# Patient Record
Sex: Male | Born: 1950 | Race: White | Hispanic: No | Marital: Married | Smoking: Former smoker
Health system: Southern US, Community
[De-identification: ages and names within clinical notes are randomized; demographics above are authoritative.]

## PROBLEM LIST (undated history)

## (undated) DIAGNOSIS — K219 Gastro-esophageal reflux disease without esophagitis: Secondary | ICD-10-CM

## (undated) DIAGNOSIS — E785 Hyperlipidemia, unspecified: Secondary | ICD-10-CM

## (undated) DIAGNOSIS — I10 Essential (primary) hypertension: Secondary | ICD-10-CM

## (undated) HISTORY — DX: Hyperlipidemia, unspecified: E78.5

## (undated) HISTORY — DX: Gastro-esophageal reflux disease without esophagitis: K21.9

## (undated) HISTORY — DX: Essential (primary) hypertension: I10

---

## 2015-12-09 DIAGNOSIS — M2011 Hallux valgus (acquired), right foot: Secondary | ICD-10-CM | POA: Diagnosis not present

## 2015-12-09 DIAGNOSIS — M898X9 Other specified disorders of bone, unspecified site: Secondary | ICD-10-CM | POA: Diagnosis not present

## 2016-03-30 DIAGNOSIS — H2512 Age-related nuclear cataract, left eye: Secondary | ICD-10-CM | POA: Diagnosis not present

## 2016-04-18 DIAGNOSIS — E7800 Pure hypercholesterolemia, unspecified: Secondary | ICD-10-CM | POA: Diagnosis not present

## 2016-04-18 DIAGNOSIS — Z79899 Other long term (current) drug therapy: Secondary | ICD-10-CM | POA: Diagnosis not present

## 2016-04-18 DIAGNOSIS — Z125 Encounter for screening for malignant neoplasm of prostate: Secondary | ICD-10-CM | POA: Diagnosis not present

## 2016-05-02 DIAGNOSIS — M5136 Other intervertebral disc degeneration, lumbar region: Secondary | ICD-10-CM | POA: Diagnosis not present

## 2016-05-02 DIAGNOSIS — M5134 Other intervertebral disc degeneration, thoracic region: Secondary | ICD-10-CM | POA: Diagnosis not present

## 2016-05-02 DIAGNOSIS — M542 Cervicalgia: Secondary | ICD-10-CM | POA: Diagnosis not present

## 2016-05-02 DIAGNOSIS — M9901 Segmental and somatic dysfunction of cervical region: Secondary | ICD-10-CM | POA: Diagnosis not present

## 2016-05-02 DIAGNOSIS — M545 Low back pain: Secondary | ICD-10-CM | POA: Diagnosis not present

## 2016-05-02 DIAGNOSIS — M5030 Other cervical disc degeneration, unspecified cervical region: Secondary | ICD-10-CM | POA: Diagnosis not present

## 2016-05-02 DIAGNOSIS — M9902 Segmental and somatic dysfunction of thoracic region: Secondary | ICD-10-CM | POA: Diagnosis not present

## 2016-05-02 DIAGNOSIS — M9903 Segmental and somatic dysfunction of lumbar region: Secondary | ICD-10-CM | POA: Diagnosis not present

## 2016-05-02 DIAGNOSIS — M546 Pain in thoracic spine: Secondary | ICD-10-CM | POA: Diagnosis not present

## 2016-05-05 DIAGNOSIS — M5136 Other intervertebral disc degeneration, lumbar region: Secondary | ICD-10-CM | POA: Diagnosis not present

## 2016-05-05 DIAGNOSIS — M9903 Segmental and somatic dysfunction of lumbar region: Secondary | ICD-10-CM | POA: Diagnosis not present

## 2016-05-05 DIAGNOSIS — M5134 Other intervertebral disc degeneration, thoracic region: Secondary | ICD-10-CM | POA: Diagnosis not present

## 2016-05-05 DIAGNOSIS — M5030 Other cervical disc degeneration, unspecified cervical region: Secondary | ICD-10-CM | POA: Diagnosis not present

## 2016-05-05 DIAGNOSIS — M546 Pain in thoracic spine: Secondary | ICD-10-CM | POA: Diagnosis not present

## 2016-05-05 DIAGNOSIS — M542 Cervicalgia: Secondary | ICD-10-CM | POA: Diagnosis not present

## 2016-05-05 DIAGNOSIS — M9901 Segmental and somatic dysfunction of cervical region: Secondary | ICD-10-CM | POA: Diagnosis not present

## 2016-05-05 DIAGNOSIS — M9902 Segmental and somatic dysfunction of thoracic region: Secondary | ICD-10-CM | POA: Diagnosis not present

## 2016-05-05 DIAGNOSIS — M545 Low back pain: Secondary | ICD-10-CM | POA: Diagnosis not present

## 2016-06-06 ENCOUNTER — Ambulatory Visit: Payer: Self-pay | Admitting: Family Medicine

## 2016-06-22 ENCOUNTER — Ambulatory Visit (INDEPENDENT_AMBULATORY_CARE_PROVIDER_SITE_OTHER): Payer: PPO | Admitting: Family Medicine

## 2016-06-22 ENCOUNTER — Encounter: Payer: Self-pay | Admitting: Family Medicine

## 2016-06-22 VITALS — BP 132/78 | HR 66 | Temp 98.2°F | Resp 17 | Ht 66.5 in | Wt 150.0 lb

## 2016-06-22 DIAGNOSIS — I10 Essential (primary) hypertension: Secondary | ICD-10-CM | POA: Diagnosis not present

## 2016-06-22 DIAGNOSIS — S51801A Unspecified open wound of right forearm, initial encounter: Secondary | ICD-10-CM | POA: Diagnosis not present

## 2016-06-22 DIAGNOSIS — Z23 Encounter for immunization: Secondary | ICD-10-CM | POA: Diagnosis not present

## 2016-06-22 MED ORDER — VALSARTAN-HYDROCHLOROTHIAZIDE 320-25 MG PO TABS: ORAL_TABLET | ORAL | 1 refills | Status: DC

## 2016-06-22 NOTE — Patient Instructions (Addendum)
Health Maintenance, Male A healthy lifestyle and preventive care is important for your health and wellness. Ask your health care provider about what schedule of regular examinations is right for you. What should I know about weight and diet?  Eat a Healthy Diet  Eat plenty of vegetables, fruits, whole grains, low-fat dairy products, and lean protein.  Do not eat a lot of foods high in solid fats, added sugars, or salt. Maintain a Healthy Weight  Regular exercise can help you achieve or maintain a healthy weight. You should:  Do at least 150 minutes of exercise each week. The exercise should increase your heart rate and make you sweat (moderate-intensity exercise).  Do strength-training exercises at least twice a week. Watch Your Levels of Cholesterol and Blood Lipids  Have your blood tested for lipids and cholesterol every 5 years starting at 67 years of age. If you are at high risk for heart disease, you should start having your blood tested when you are 67 years old. You may need to have your cholesterol levels checked more often if:  Your lipid or cholesterol levels are high.  You are older than 67 years of age.  You are at high risk for heart disease. What should I know about cancer screening? Many types of cancers can be detected early and may often be prevented. Lung Cancer  You should be screened every year for lung cancer if:  You are a current smoker who has smoked for at least 30 years.  You are a former smoker who has quit within the past 15 years.  Talk to your health care provider about your screening options, when you should start screening, and how often you should be screened. Colorectal Cancer  Routine colorectal cancer screening usually begins at 67 years of age and should be repeated every 5-10 years until you are 67 years old. You may need to be screened more often if early forms of precancerous polyps or small growths are found. Your health care provider may  recommend screening at an earlier age if you have risk factors for colon cancer.  Your health care provider may recommend using home test kits to check for hidden blood in the stool.  A small camera at the end of a tube can be used to examine your colon (sigmoidoscopy or colonoscopy). This checks for the earliest forms of colorectal cancer. Prostate and Testicular Cancer  Depending on your age and overall health, your health care provider may do certain tests to screen for prostate and testicular cancer.  Talk to your health care provider about any symptoms or concerns you have about testicular or prostate cancer. Skin Cancer  Check your skin from head to toe regularly.  Tell your health care provider about any new moles or changes in moles, especially if:  There is a change in a mole's size, shape, or color.  You have a mole that is larger than a pencil eraser.  Always use sunscreen. Apply sunscreen liberally and repeat throughout the day.  Protect yourself by wearing long sleeves, pants, a wide-brimmed hat, and sunglasses when outside. What should I know about heart disease, diabetes, and high blood pressure?  If you are 18-39 years of age, have your blood pressure checked every 3-5 years. If you are 40 years of age or older, have your blood pressure checked every year. You should have your blood pressure measured twice-once when you are at a hospital or clinic, and once when you are not at a   hospital or clinic. Record the average of the two measurements. To check your blood pressure when you are not at a hospital or clinic, you can use:  An automated blood pressure machine at a pharmacy.  A home blood pressure monitor.  Talk to your health care provider about your target blood pressure.  If you are between 52-74 years old, ask your health care provider if you should take aspirin to prevent heart disease.  Have regular diabetes screenings by checking your fasting blood sugar  level.  If you are at a normal weight and have a low risk for diabetes, have this test once every three years after the age of 61.  If you are overweight and have a high risk for diabetes, consider being tested at a younger age or more often.  A one-time screening for abdominal aortic aneurysm (AAA) by ultrasound is recommended for men aged 24-75 years who are current or former smokers. What should I know about preventing infection? Hepatitis B  If you have a higher risk for hepatitis B, you should be screened for this virus. Talk with your health care provider to find out if you are at risk for hepatitis B infection. Hepatitis C  Blood testing is recommended for:  Everyone born from [birth year] through 1966.  Anyone with known risk factors for hepatitis C. Sexually Transmitted Diseases (STDs)  You should be screened each year for STDs including gonorrhea and chlamydia if:  You are sexually active and are younger than 67 years of age.  You are older than 67 years of age and your health care provider tells you that you are at risk for this type of infection.  Your sexual activity has changed since you were last screened and you are at an increased risk for chlamydia or gonorrhea. Ask your health care provider if you are at risk.  Talk with your health care provider about whether you are at high risk of being infected with HIV. Your health care provider may recommend a prescription medicine to help prevent HIV infection. What else can I do?  Schedule regular health, dental, and eye exams.  Stay current with your vaccines (immunizations).  Do not use any tobacco products, such as cigarettes, chewing tobacco, and e-cigarettes. If you need help quitting, ask your health care provider.  Limit alcohol intake to no more than 2 drinks per day. One drink equals 12 ounces of beer, 5 ounces of wine, or 1 ounces of hard liquor.  Do not use street drugs.  Do not share needles.  Ask your health  care provider for help if you need support or information about quitting drugs.  Tell your health care provider if you often feel depressed.  Tell your health care provider if you have ever been abused or do not feel safe at home. This information is not intended to replace advice given to you by your health care provider. Make sure you discuss any questions you have with your health care provider. Document Released: 09/10/2007 Document Revised: 11/11/2015 Document Reviewed: 12/16/2014 Elsevier Interactive Patient Education  2017 [place] (Tetanus and Diphtheria): What You Need to Know 1. Why get vaccinated? Tetanus  and diphtheria are very serious diseases. They are rare in the [place] today, but people who do become infected often have severe complications. Td vaccine is used to protect adolescents and adults from both of these diseases. Both tetanus and diphtheria are infections caused by bacteria. Diphtheria spreads from person to person through  coughing or sneezing. Tetanus-causing bacteria enter the body through cuts, scratches, or wounds. TETANUS (lockjaw) causes painful muscle tightening and stiffness, usually all over the body.  It can lead to tightening of muscles in the head and neck so you can't open your mouth, swallow, or sometimes even breathe. Tetanus kills about 1 out of every 10 people who are infected even after receiving the best medical care. DIPHTHERIA can cause a thick coating to form in the back of the throat.  It can lead to breathing problems, paralysis, heart failure, and death. Before vaccines, as many as 200,000 cases of diphtheria and hundreds of cases of tetanus were reported in the [place] each year. Since vaccination began, reports of cases for both diseases have dropped by about 99%. 2. Td vaccine Td vaccine can protect adolescents and adults from tetanus and diphtheria. Td is usually given as a booster dose every 10 years but it can  also be given earlier after a severe and dirty wound or burn. Another vaccine, called Tdap, which protects against pertussis in addition to tetanus and diphtheria, is sometimes recommended instead of Td vaccine. Your doctor or the person giving you the vaccine can give you more information. Td may safely be given at the same time as other vaccines. 3. Some people should not get this vaccine  A person who has ever had a life-threatening allergic reaction after a previous dose of any tetanus or diphtheria containing vaccine, OR has a severe allergy to any part of this vaccine, should not get Td vaccine. Tell the person giving the vaccine about any severe allergies.  Talk to your doctor if you:  had severe pain or swelling after any vaccine containing diphtheria or tetanus,  ever had a condition called Guillain Barre Syndrome (GBS),  aren't feeling well on the day the shot is scheduled. 4. What are the risks from Td vaccine? With any medicine, including vaccines, there is a chance of side effects. These are usually mild and go away on their own. Serious reactions are also possible but are rare. Most people who get Td vaccine do not have any problems with it. Mild problems following Td vaccine:  (Did not interfere with activities)  Pain where the shot was given (about 8 people in 10)  Redness or swelling where the shot was given (about 1 person in 4)  Mild fever (rare)  Headache (about 1 person in 4)  Tiredness (about 1 person in 4) Moderate problems following Td vaccine:  (Interfered with activities, but did not require medical attention)  Fever over 102F (rare) Severe problems following Td vaccine:  (Unable to perform usual activities; required medical attention)  Swelling, severe pain, bleeding and/or redness in the arm where the shot was given (rare). Problems that could happen after any vaccine:   People sometimes faint after a medical procedure, including vaccination.  Sitting or lying down for about 15 minutes can help prevent fainting, and injuries caused by a fall. Tell your doctor if you feel dizzy, or have vision changes or ringing in the ears.  Some people get severe pain in the shoulder and have difficulty moving the arm where a shot was given. This happens very rarely.  Any medication can cause a severe allergic reaction. Such reactions from a vaccine are very rare, estimated at fewer than 1 in a million doses, and would happen within a few minutes to a few hours after the vaccination. As with any medicine, there is a very remote chance  of a vaccine causing a serious injury or death. The safety of vaccines is always being monitored. For more information, visit: http://floyd.org/www.cdc.gov/vaccinesafety/ 5. What if there is a serious reaction? What should I look for?  Look for anything that concerns you, such as signs of a severe allergic reaction, very high fever, or unusual behavior. Signs of a severe allergic reaction can include hives, swelling of the face and throat, difficulty breathing, a fast heartbeat, dizziness, and weakness. These would usually start a few minutes to a few hours after the vaccination. What should I do?   If you think it is a severe allergic reaction or other emergency that can't wait, call 9-1-1 or get the person to the nearest hospital. Otherwise, call your doctor.  Afterward, the reaction should be reported to the Vaccine Adverse Event Reporting System (VAERS). Your doctor might file this report, or you can do it yourself through the VAERS web site at www.vaers.LAgents.nohhs.gov, or by calling 1-281-033-1454.  VAERS does not give medical advice. 6. The National Vaccine Injury Compensation Program The [employer] Vaccine Injury Compensation Program (VICP) is a federal program that was created to compensate people who may have been injured by certain vaccines. Persons who believe they may have been injured by a vaccine can learn about the program and  about filing a claim by calling 1-504-873-6121 or visiting the VICP website at SpiritualWord.atwww.hrsa.gov/vaccinecompensation. There is a time limit to file a claim for compensation. 7. How can I learn more?  Ask your doctor. He or she can give you the vaccine package insert or suggest other sources of information.  Call your local or state health department.  Contact the Centers for Disease Control and Prevention (CDC):  Call (910)503-21351-864 831 1735 (1-800-CDC-INFO)  Visit CDC's website at PicCapture.uywww.cdc.gov/vaccines CDC Td Vaccine VIS (07/07/15) This information is not intended to replace advice given to you by your health care provider. Make sure you discuss any questions you have with your health care provider. Document Released: 01/09/2006 Document Revised: 12/03/2015 Document Reviewed: 12/03/2015 Elsevier Interactive Patient Education  2017 [employer]

## 2016-06-22 NOTE — Progress Notes (Signed)
BP 132/78 (BP Location: Left Arm, Patient Position: Sitting, Cuff Size: Normal)   Pulse 66   Temp 98.2 F (36.8 C) (Oral)   Resp 17   Ht 5' 6.5" (1.689 m)   Wt 150 lb (68 kg)   SpO2 98%   BMI 23.85 kg/m    Subjective:    Patient ID: Steven Browning, male    DOB: 09/05/1950, 66 y.o.   MRN: [id number]  HPI: Steven Browning is a 66 y.o. male who presents today to establish care. [facility]  Chief Complaint  Patient presents with  . Establish Care   HYPERTENSION Hypertension status: controlled  Satisfied with current treatment? yes Duration of hypertension: chronic BP monitoring frequency:  not checking BP medication side effects:  no Medication compliance: excellent compliance Previous BP meds: valsartan Aspirin: no Recurrent headaches: no Visual changes: no Palpitations: no Dyspnea: no Chest pain: no Lower extremity edema: no Dizzy/lightheaded: no  Active Ambulatory Problems    Diagnosis Date Noted  . Hypertension    Resolved Ambulatory Problems    Diagnosis Date Noted  . No Resolved Ambulatory Problems   Past Medical History:  Diagnosis Date  . GERD (gastroesophageal reflux disease)   . Hyperlipidemia   . Hypertension    History reviewed. No pertinent surgical history.  Outpatient Encounter Prescriptions as of 06/22/2016  Medication Sig  . Arginine 500 MG CAPS Take 500 mg by mouth daily. Takes 4 per day  . Ascorbic Acid (VITAMIN C) 1000 MG tablet Take 1,000 mg by mouth daily. Takes 4 per day  . b complex vitamins tablet Take 1 tablet by mouth daily.  . Calcium Carb-Cholecalciferol (CALCIUM + D3 PO) Take 600 mg by mouth 4 (four) times daily.  Marland Kitchen. CINNAMON PO Take 1,000 mg by mouth daily.  Marland Kitchen. CRANBERRY EXTRACT PO Take 68 mg by mouth daily. Takes 3 per day  . Ginkgo Biloba 40 MG TABS Take 60 mg by mouth daily.  Marland Kitchen. GLUCOSAMINE HCL-MSM PO Take 3,000 mg by mouth 2 (two) times daily.  . Grape Seed Extract 100 MG CAPS Take 100 mg by mouth daily. Takes  2 per day  . IRON PO Take 65 mg by mouth daily. Takes 2 per day  . Lactobacillus (PROBIOTIC ACIDOPHILUS PO) Take by mouth daily.  . Lycopene 10 MG CAPS Take 10 mg by mouth daily.  . Magnesium 500 MG CAPS Take 500 mg by mouth daily.  . milk thistle 175 MG tablet Take 175 mg by mouth daily. Takes 2 per day  . Multiple Vitamins-Minerals (MULTIVITAMIN WITH MINERALS) tablet Take 1 tablet by mouth daily. Takes 2 per day  . NON FORMULARY Take 565 mg by mouth daily. Takes 3 per day  . Nutritional Supplements (SYTRINOL PO) Take 150 mg by mouth daily. Takes 2 per day  . omega-3 acid ethyl esters (LOVAZA) 1 g capsule Take by mouth daily. Takes 6 per day  . Plant Sterols and Stanols (CHOLEST OFF PO) Take by mouth. Takes 6 per day  . Potassium 99 MG TABS Take 99 mg by mouth daily.  . Saw Palmetto 450 MG CAPS Take 450 mg by mouth daily. Takes 2 per day  . Turmeric 500 MG TABS Take 500 mg by mouth daily. Takes 2 per day  . valsartan-hydrochlorothiazide (DIOVAN-HCT) 320-25 MG tablet TK 1 T PO QD  . vitamin E 400 UNIT capsule Take 400 Units by mouth daily. Takes 2 per day  . [DISCONTINUED] valsartan-hydrochlorothiazide (DIOVAN-HCT) 320-25 MG tablet TK  1 T PO QD   No facility-administered encounter medications on file as of 06/22/2016.    Allergies  Allergen Reactions  . Augmentin [Amoxicillin-Pot Clavulanate] Other (See Comments)    Stomach upset  . Statins Other (See Comments)    Muscle spasms    Social History   Social History  . Marital status: Married    Spouse name: N/A  . Number of children: N/A  . Years of education: N/A   Occupational History  . Not on file.   Social History Main Topics  . Smoking status: Former Smoker    Types: Cigarettes    Quit date: 03/28/1973  . Smokeless tobacco: Never Used  . Alcohol use No  . Drug use: No  . Sexual activity: Not on file   Other Topics Concern  . Not on file   Social History Narrative  . No narrative on file   Family History  Problem  Relation Age of Onset  . Asthma Mother   . COPD Mother   . Stroke Father   . Heart disease Father   . Heart disease Brother   . Dementia Sister    Review of Systems  Constitutional: Negative.   Respiratory: Negative.   Cardiovascular: Negative.   Gastrointestinal: Negative.   Psychiatric/Behavioral: Negative.     Per HPI unless specifically indicated above     Objective:    BP 132/78 (BP Location: Left Arm, Patient Position: Sitting, Cuff Size: Normal)   Pulse 66   Temp 98.2 F (36.8 C) (Oral)   Resp 17   Ht 5' 6.5" (1.689 m)   Wt 150 lb (68 kg)   SpO2 98%   BMI 23.85 kg/m   Wt Readings from Last 3 Encounters:  06/22/16 150 lb (68 kg)    Physical Exam  Constitutional: He is oriented to person, place, and time. He appears well-developed and well-nourished. No distress.  HENT:  Head: Normocephalic and atraumatic.  Right Ear: Hearing normal.  Left Ear: Hearing normal.  Nose: Nose normal.  Eyes: Conjunctivae and lids are normal. Right eye exhibits no discharge. Left eye exhibits no discharge. No scleral icterus.  Cardiovascular: Normal rate, regular rhythm, normal heart sounds and intact distal pulses.  Exam reveals no gallop and no friction rub.   No murmur heard. Pulmonary/Chest: Effort normal and breath sounds normal. No respiratory distress. He has no wheezes. He has no rales. He exhibits no tenderness.  Musculoskeletal: Normal range of motion.  Neurological: He is alert and oriented to person, place, and time.  Skin: Skin is warm, dry and intact. No rash noted. No erythema. No pallor.  Small well healing wound on R forearm  Psychiatric: He has a normal mood and affect. His speech is normal and behavior is normal. Judgment and thought content normal. Cognition and memory are normal.  Nursing note and vitals reviewed.   No results found for this or any previous visit.    Assessment & Plan:   Problem List Items Addressed This Visit      Cardiovascular and  Mediastinum   Hypertension - Primary   Relevant Medications   omega-3 acid ethyl esters (LOVAZA) 1 g capsule   valsartan-hydrochlorothiazide (DIOVAN-HCT) 320-25 MG tablet    Other Visit Diagnoses    Open wound of right forearm, initial encounter       Small, healing well, Due for Td. Given today.   Relevant Orders   Td : Tetanus/diphtheria >7yo Preservative  free (Completed)  Follow up plan: Return When due for physical- will check records, for Records release [place] please.

## 2016-07-31 ENCOUNTER — Emergency Department
Admission: EM | Admit: 2016-07-31 | Discharge: 2016-08-01 | Disposition: A | Discharge status: Home / Self Care | Payer: PPO | Attending: Emergency Medicine | Admitting: Emergency Medicine

## 2016-07-31 ENCOUNTER — Emergency Department: Payer: PPO

## 2016-07-31 DIAGNOSIS — R0789 Other chest pain: Secondary | ICD-10-CM | POA: Diagnosis not present

## 2016-07-31 DIAGNOSIS — R079 Chest pain, unspecified: Secondary | ICD-10-CM | POA: Diagnosis not present

## 2016-07-31 DIAGNOSIS — Z87891 Personal history of nicotine dependence: Secondary | ICD-10-CM | POA: Insufficient documentation

## 2016-07-31 DIAGNOSIS — I10 Essential (primary) hypertension: Secondary | ICD-10-CM | POA: Diagnosis not present

## 2016-07-31 DIAGNOSIS — Z79899 Other long term (current) drug therapy: Secondary | ICD-10-CM | POA: Diagnosis not present

## 2016-07-31 LAB — TROPONIN I: Troponin I: <0.03 ng/mL (ref <0.03)

## 2016-07-31 LAB — BASIC METABOLIC PANEL
Anion gap: 6 (ref 5–15)
BUN: 38 mg/dL — AB (ref 6–20)
CO2: 30 mmol/L (ref 22–32)
Calcium: 10.3 mg/dL (ref 8.9–10.3)
Chloride: 102 mmol/L (ref 101–111)
Creatinine, Ser: 1.43 mg/dL — ABNORMAL HIGH (ref 0.61–1.24)
GFR calc Af Amer: 57 mL/min — ABNORMAL LOW (ref >60)
GFR, EST NON AFRICAN AMERICAN: 50 mL/min — AB (ref >60)
GLUCOSE: 113 mg/dL — AB (ref 65–99)
POTASSIUM: 3.3 mmol/L — AB (ref 3.5–5.1)
Sodium: 138 mmol/L (ref 135–145)

## 2016-07-31 LAB — CBC
HEMATOCRIT: 37.3 % — AB (ref 40.0–52.0)
HEMOGLOBIN: 13.3 g/dL (ref 13.0–18.0)
MCH: 33.7 pg (ref 26.0–34.0)
MCHC: 35.7 g/dL (ref 32.0–36.0)
MCV: 94.3 fL (ref 80.0–100.0)
Platelets: 208 10*3/uL (ref 150–440)
RBC: 3.95 MIL/uL — ABNORMAL LOW (ref 4.40–5.90)
RDW: 11.8 % (ref 11.5–14.5)
WBC: 7.6 10*3/uL (ref 3.8–10.6)

## 2016-07-31 NOTE — ED Provider Notes (Signed)
[facility] Emergency Department Provider Note   ____________________________________________   I have reviewed the triage vital signs and the nursing notes.   HISTORY  Chief Complaint Chest Pain   History limited by: Not Limited   HPI Steven Browning is a 66 y.o. male who presents to the emergency department today because of concerns for chest pain. The chest pain was located centrally. It did not radiate into his arms or up into his neck. It did not radiate to his back. He describes it as a squeezing type sensation. It lasted maybe 15 minutes and then went away. He states he was not doing any exertion when it started. He denies any associated shortness of breath. He denies any recent illnesses. No fevers. States he has had similar pain in the past although it was short-lived. He does think that it could be related to indigestion.   Past Medical History:  Diagnosis Date  . GERD (gastroesophageal reflux disease)   . Hyperlipidemia   . Hypertension     Patient Active Problem List   Diagnosis Date Noted  . Hypertension     No past surgical history on file.  Prior to Admission medications   Medication Sig Start Date End Date Taking? Authorizing Provider  Arginine 500 MG CAPS Take 500 mg by mouth daily. Takes 4 per day    [provider]  Ascorbic Acid (VITAMIN C) 1000 MG tablet Take 1,000 mg by mouth daily. Takes 4 per day    [provider]  b complex vitamins tablet Take 1 tablet by mouth daily.    [provider]  Calcium Carb-Cholecalciferol (CALCIUM + D3 PO) Take 600 mg by mouth 4 (four) times daily.    [provider]  CINNAMON PO Take 1,000 mg by mouth daily.    [provider]  CRANBERRY EXTRACT PO Take 68 mg by mouth daily. Takes 3 per day    [provider]  Ginkgo Biloba 40 MG TABS Take 60 mg by mouth daily.    [provider]  GLUCOSAMINE HCL-MSM PO Take 3,000 mg by mouth 2 (two)  times daily.    [provider]  Grape Seed Extract 100 MG CAPS Take 100 mg by mouth daily. Takes 2 per day    [provider]  IRON PO Take 65 mg by mouth daily. Takes 2 per day    [provider]  Lactobacillus (PROBIOTIC ACIDOPHILUS PO) Take by mouth daily.    [provider]  Lycopene 10 MG CAPS Take 10 mg by mouth daily.    [provider]  Magnesium 500 MG CAPS Take 500 mg by mouth daily.    [provider]  milk thistle 175 MG tablet Take 175 mg by mouth daily. Takes 2 per day    [provider]  Multiple Vitamins-Minerals (MULTIVITAMIN WITH MINERALS) tablet Take 1 tablet by mouth daily. Takes 2 per day    [provider]  NON FORMULARY Take 565 mg by mouth daily. Takes 3 per day    [provider]  Nutritional Supplements (SYTRINOL PO) Take 150 mg by mouth daily. Takes 2 per day    [provider]  omega-3 acid ethyl esters (LOVAZA) 1 g capsule Take by mouth daily. Takes 6 per day    [provider]  Plant Sterols and Stanols (CHOLEST OFF PO) Take by mouth. Takes 6 per day    [provider]  Potassium 99 MG TABS Take 99  mg by mouth daily.    [provider]  Saw Palmetto 450 MG CAPS Take 450 mg by mouth daily. Takes 2 per day    [provider]  Turmeric 500 MG TABS Take 500 mg by mouth daily. Takes 2 per day    [provider]  valsartan-hydrochlorothiazide (DIOVAN-HCT) 320-25 MG tablet TK 1 T PO QD 06/22/16   Johnson, Megan P, DO  vitamin E 400 UNIT capsule Take 400 Units by mouth daily. Takes 2 per day    [provider]    Allergies Augmentin [amoxicillin-pot clavulanate] and Statins  Family History  Problem Relation Age of Onset  . Asthma Mother   . COPD Mother   . Stroke Father   . Heart disease Father   . Heart disease Brother   . Dementia Sister     Social History Social History  Substance Use Topics  . Smoking status:  Former Smoker    Types: Cigarettes    Quit date: 03/28/1973  . Smokeless tobacco: Never Used  . Alcohol use No    Review of Systems Constitutional: No fever/chills Eyes: No visual changes. ENT: No sore throat. Cardiovascular: Positive chest pain. Respiratory: Denies shortness of breath. Gastrointestinal: No abdominal pain.  No nausea, no vomiting.  No diarrhea.   Genitourinary: Negative for dysuria. Musculoskeletal: Negative for back pain. Skin: Negative for rash. Neurological: Negative for headaches, focal weakness or numbness.  ____________________________________________   PHYSICAL EXAM:  VITAL SIGNS: ED Triage Vitals  Enc Vitals Group     BP 07/31/16 2055 (!) 167/62     Pulse Rate 07/31/16 2055 66     Resp 07/31/16 2055 18     Temp 07/31/16 2055 98.3 F (36.8 C)     Temp Source 07/31/16 2055 Oral     SpO2 07/31/16 2055 97 %     Weight 07/31/16 2055 150 lb (68 kg)     Height 07/31/16 2055 5\' 8"  (1.727 m)     Head Circumference --      Peak Flow --      Pain Score 07/31/16 2052 1   Constitutional: Alert and oriented. Well appearing and in no distress. Eyes: Conjunctivae are normal. Normal extraocular movements. ENT   Head: Normocephalic and atraumatic.   Nose: No congestion/rhinnorhea.   Mouth/Throat: Mucous membranes are moist.   Neck: No stridor. Hematological/Lymphatic/Immunilogical: No cervical lymphadenopathy. Cardiovascular: Normal rate, regular rhythm.  No murmurs, rubs, or gallops.  Respiratory: Normal respiratory effort without tachypnea nor retractions. Breath sounds are clear and equal bilaterally. No wheezes/rales/rhonchi. Gastrointestinal: Soft and non tender. No rebound. No guarding.  Genitourinary: Deferred Musculoskeletal: Normal range of motion in all extremities. No lower extremity edema. Neurologic:  Normal speech and language. No gross focal neurologic deficits are appreciated.  Skin:  Skin is warm, dry and intact. No rash  noted. Psychiatric: Mood and affect are normal. Speech and behavior are normal. Patient exhibits appropriate insight and judgment.  ____________________________________________    LABS (pertinent positives/negatives)  Labs Reviewed  BASIC METABOLIC PANEL - Abnormal; Notable for the following:       Result Value   Potassium 3.3 (*)    Glucose, Bld 113 (*)    BUN 38 (*)    Creatinine, Ser 1.43 (*)    GFR calc non Af Amer 50 (*)    GFR calc Af Amer 57 (*)    All other components within normal limits  CBC - Abnormal; Notable for the following:    RBC 3.95 (*)  HCT 37.3 (*)    All other components within normal limits  TROPONIN I  TROPONIN I     ____________________________________________   EKG  I, Phineas Semen, attending physician, personally viewed and interpreted this EKG  EKG Time: 2053 Rate: 64 Rhythm: normal sinus rhythm Axis: normal Intervals: qtc 389 QRS: narrow ST changes: no st elevation Impression: abnormal ekg   ____________________________________________    RADIOLOGY  CXR IMPRESSION: No acute cardiopulmonary process seen.   ____________________________________________   PROCEDURES  Procedures  ____________________________________________   INITIAL IMPRESSION / ASSESSMENT AND PLAN / ED COURSE  Pertinent labs & imaging results that were available during my care of the patient were reviewed by me and considered in my medical decision making (see chart for details).  Patient presented to the emergency department today because of concerns for central chest pain. Initial troponin was negative. Will check a second troponin.  ____________________________________________   FINAL CLINICAL IMPRESSION(S) / ED DIAGNOSES  Chest pain  Note: This dictation was prepared with Dragon dictation. Any transcriptional errors that result from this process are unintentional     Phineas Semen, MD 07/31/16 [phone]

## 2016-07-31 NOTE — ED Triage Notes (Signed)
Pt states tonight he was getting ready for bed and started experiencing chest pain, pt states that he has never felt this before and points to the rt mid side of his chest, pt reports some tightness and difficulty with breathing, reports some indigestion, no nausea, pt denies pain at this time, pt states that it lasted approx 15 min and that his bp jumped up 30 points higher than normal

## 2016-08-01 LAB — TROPONIN I

## 2016-08-01 NOTE — ED Notes (Signed)
E-signature pad not working in room. Pt verbalized no questions at this time.

## 2016-08-01 NOTE — Discharge Instructions (Signed)
Continue all medications as directed by your doctor.  Return to the ER for worsening symptoms, persistent vomiting, difficulty breathing or other concerns. °

## 2016-08-01 NOTE — ED Provider Notes (Signed)
-----------------------------------------   12:38 AM on 08/01/2016 -----------------------------------------  Patient resting in no acute distress. Voices no chest pain. Repeat troponin remains negative. Will follow up with cardiology as an outpatient. Strict return precautions given. Patient verbalizes understanding and agrees with plan of care.   Irean HongSung, Lamone Ferrelli J, MD 08/01/16 [phone]

## 2016-08-16 ENCOUNTER — Encounter: Payer: Self-pay | Admitting: Podiatry

## 2016-08-16 ENCOUNTER — Ambulatory Visit (INDEPENDENT_AMBULATORY_CARE_PROVIDER_SITE_OTHER): Payer: PPO | Admitting: Podiatry

## 2016-08-16 ENCOUNTER — Ambulatory Visit: Payer: PPO

## 2016-08-16 ENCOUNTER — Ambulatory Visit (INDEPENDENT_AMBULATORY_CARE_PROVIDER_SITE_OTHER): Payer: PPO

## 2016-08-16 DIAGNOSIS — M79675 Pain in left toe(s): Secondary | ICD-10-CM

## 2016-08-16 DIAGNOSIS — L84 Corns and callosities: Secondary | ICD-10-CM

## 2016-08-16 DIAGNOSIS — M7752 Other enthesopathy of left foot: Secondary | ICD-10-CM | POA: Diagnosis not present

## 2016-08-16 NOTE — Progress Notes (Signed)
   HPI:  66 year old male presents the office today for evaluation of a painful corn lesion to the medial aspect of the second digit left foot. Patient states that the corn has been present for several months now. He experiences intermittent burning pain if he does not wear the toe spacer. Patient has been treated in the past by another podiatrist who stated that there was nothing to do to alleviate symptoms for the patient.    Physical Exam: General: The patient is alert and oriented x3 in no acute distress.  Dermatology: Circular callus lesion noted overlying the PIPJ of the medial aspect second digit left foot. There appears to be no open lesion. Skin is warm, dry and supple bilateral lower extremities. Negative for open lesions or macerations.  Vascular: Palpable pedal pulses bilaterally. No edema or erythema noted. Capillary refill within normal limits.  Neurological: Epicritic and protective threshold grossly intact bilaterally.   Musculoskeletal Exam: Range of motion within normal limits to all pedal and ankle joints bilateral. Muscle strength 5/5 in all groups bilateral.   Radiographic Exam:  Skin lesion marker noted overlying the corn and x-rays are brisk visualized on AP view consistent with bone spur formation at the level of the PIPJ second digit and IPJ hallux left foot. These bone spurs are likely contributory to the callus/corn symptoms  Assessment: 1.  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 Patient was evaluated. X-rays were reviewed  2. Today we discussed conservative versus surgical management of the painful corn lesion and underlying bone spur formation. The patient opts for surgical correction. Surgical correction will consist of exostectomy second digit left foot and exostectomy left great toe. All possible, occasions and details the procedure were explained.  No guarantees were expressed or implied. All patient questions were answered. Authorization for surgery was initiated today. 3. Return to clinic 1 week postop   Felecia ShellingBrent M. Shadawn Hanaway, DPM [facility]  Dr. Felecia ShellingBrent M. Adalyne Lovick, DPM    [address]                                        [place], [place] [address]                Office [phone]  Fax [phone]

## 2016-09-05 ENCOUNTER — Ambulatory Visit: Payer: PPO | Admitting: Cardiovascular Disease

## 2016-11-04 ENCOUNTER — Ambulatory Visit (INDEPENDENT_AMBULATORY_CARE_PROVIDER_SITE_OTHER): Payer: PPO

## 2016-11-04 VITALS — BP 160/82 | HR 56 | Temp 97.9°F | Resp 16 | Ht 68.0 in | Wt 152.7 lb

## 2016-11-04 DIAGNOSIS — Z0000 Encounter for general adult medical examination without abnormal findings: Secondary | ICD-10-CM

## 2016-11-04 NOTE — Progress Notes (Signed)
Subjective:   Steven Steven Browning is a 66 y.o. male who presents for Medicare Annual/Subsequent preventive examination.  Review of Systems:  Cardiac Risk Factors include: male gender;advanced age (>5455men, 71>65 women);hypertension     Objective:    Vitals: BP (!) 160/82 (BP Location: Left Arm, Cuff Size: Normal) Comment: recheck  Pulse (!) 56   Temp 97.9 F (36.6 C)   Resp 16   Ht 5\' 8"  (1.727 m)   Wt 152 lb 11.2 oz (69.3 kg)   BMI 23.22 kg/m   Body mass index is 23.22 kg/m.  Tobacco History  Smoking Status  . Former Smoker  . Types: Cigarettes  . Quit date: 03/28/1973  Smokeless Tobacco  . Never Used     Counseling given: Not Answered   Past Medical History:  Diagnosis Date  . GERD (gastroesophageal reflux disease)   . Hyperlipidemia   . Hypertension    History reviewed. No pertinent surgical history. Family History  Problem Relation Age of Onset  . Asthma Mother   . COPD Mother   . Stroke Father   . Heart disease Father   . Heart disease Brother   . Dementia Sister    History  Sexual Activity  . Sexual activity: Not on file    Outpatient Encounter Prescriptions as of 11/04/2016  Medication Sig  . Arginine 500 MG CAPS Take 500 mg by mouth daily. Takes 4 per day  . Ascorbic Acid (VITAMIN C) 1000 MG tablet Take 1,000 mg by mouth daily. Takes 4 per day  . b complex vitamins tablet Take 1 tablet by mouth daily.  . Calcium Carb-Cholecalciferol (CALCIUM + D3 PO) Take 600 mg by mouth 4 (four) times daily.  Marland Kitchen. CINNAMON PO Take 1,000 mg by mouth daily.  Marland Kitchen. CRANBERRY EXTRACT PO Take 68 mg by mouth daily. Takes 3 per day  . Ginkgo Biloba 40 MG TABS Take 60 mg by mouth daily.  Marland Kitchen. GLUCOSAMINE HCL-MSM PO Take 3,000 mg by mouth 2 (two) times daily.  . Grape Seed Extract 100 MG CAPS Take 100 mg by mouth daily. Takes 2 per day  . IRON PO Take 65 mg by mouth daily. Takes 2 per day  . Lactobacillus (PROBIOTIC ACIDOPHILUS PO) Take by mouth daily.  . Lycopene 10 MG CAPS Take 10  mg by mouth daily.  . Magnesium 500 MG CAPS Take 500 mg by mouth daily.  . milk thistle 175 MG tablet Take 175 mg by mouth daily. Takes 2 per day  . Multiple Vitamins-Minerals (MULTIVITAMIN WITH MINERALS) tablet Take 1 tablet by mouth daily. Takes 2 per day  . NON FORMULARY Take 565 mg by mouth daily. Takes 3 per day  . Nutritional Supplements (SYTRINOL PO) Take 150 mg by mouth daily. Takes 2 per day  . omega-3 acid ethyl esters (LOVAZA) 1 g capsule Take by mouth daily. Takes 6 per day  . Plant Sterols and Stanols (CHOLEST OFF PO) Take by mouth. Takes 6 per day  . Potassium 99 MG TABS Take 99 mg by mouth daily.  . Saw Palmetto 450 MG CAPS Take 450 mg by mouth daily. Takes 2 per day  . Turmeric 500 MG TABS Take 500 mg by mouth daily. Takes 2 per day  . valsartan-hydrochlorothiazide (DIOVAN-HCT) 320-25 MG tablet TK 1 T PO QD  . vitamin E 400 UNIT capsule Take 400 Units by mouth daily. Takes 2 per day   No facility-administered encounter medications on file as of 11/04/2016.     Activities  of Daily Living In your present state of health, do you have any difficulty performing the following activities: 11/04/2016  Hearing? N  Vision? N  Difficulty concentrating or making decisions? N  Walking or climbing stairs? N  Dressing or bathing? N  Doing errands, shopping? N  Preparing Food and eating ? N  Using the Toilet? N  In the past six months, have you accidently leaked urine? N  Do you have problems with loss of bowel control? N  Managing your Medications? N  Managing your Finances? N  Housekeeping or managing your Housekeeping? N    Patient Care Team: Dorcas Carrow, DO as PCP - General (Family Medicine)   Assessment:     Exercise Activities and Dietary recommendations Current Exercise Habits: Home exercise routine, Time (Minutes): 60 (30 min in am 30 min in pm ), Frequency (Times/Week): 7, Weekly Exercise (Minutes/Week): 420, Intensity: Mild, Exercise limited by: None  identified  Goals    None     Fall Risk Fall Risk  11/04/2016 06/22/2016  Falls in the past year? No No   Depression Screen PHQ 2/9 Scores 11/04/2016 06/22/2016  PHQ - 2 Score 0 0    Cognitive Function     6CIT Screen 11/04/2016  What Year? 0 points  What month? 0 points  What time? 0 points  Count back from 20 0 points  Months in reverse 0 points  Repeat phrase 0 points  Total Score 0    Immunization History  Administered Date(s) Administered  . Td 06/22/2016   Screening Tests Health Maintenance  Topic Date Due  . INFLUENZA VACCINE  10/26/2016  . PNA vac Low Risk Adult (1 of 2 - PCV13) 06/22/2017 (Originally 08/30/2014)  . COLONOSCOPY  03/28/2018  . TETANUS/TDAP  06/23/2026  . Hepatitis C Screening  Completed      Plan:    I have personally reviewed and addressed the Medicare Annual Wellness questionnaire and have noted the following in the patient's chart:  A. Medical and social history B. Use of alcohol, tobacco or illicit drugs  C. Current medications and supplements D. Functional ability and status E.  Nutritional status F.  Physical activity G. Advance directives H. List of other physicians I.  Hospitalizations, surgeries, and ER visits in previous 12 months J.  Vitals K. Screenings such as hearing and vision if needed, cognitive and depression L. Referrals and appointments   In addition, I have reviewed and discussed with patient certain preventive protocols, quality metrics, and best practice recommendations. A written personalized care plan for preventive services as well as general preventive health recommendations were provided to patient.   Signed,  Marin Roberts, LPN Nurse Health Advisor   MD Recommendations:

## 2016-11-04 NOTE — Patient Instructions (Addendum)
Steven Browning , Thank you for taking time to come for your Medicare Wellness Visit. I appreciate your ongoing commitment to your health goals. Please review the following plan we discussed and let me know if I can assist you in the future.   Screening recommendations/referrals: Colonoscopy: Completed 03/28/2008 Recommended yearly ophthalmology/optometry visit for glaucoma screening and checkup Recommended yearly dental visit for hygiene and checkup  Vaccinations: Influenza vaccine: due 11/2016 Pneumococcal vaccine: due now- declined at this time Tdap vaccine: up to date Shingles vaccine: due, check with your insurance company for coverage   Advanced directives: Advance directive discussed with you today. I have provided a copy for you to complete at home and have notarized. Once this is complete please bring a copy in to our office so we can scan it into your chart.  Conditions/risks identified: none   Next appointment: Follow up in one year for your annual wellness exam.   Preventive Care 67 Years and Older, Male Preventive care refers to lifestyle choices and visits with your health care provider that can promote health and wellness. What does preventive care include?  A yearly physical exam. This is also called an annual well check.  Dental exams once or twice a year.  Routine eye exams. Ask your health care provider how often you should have your eyes checked.  Personal lifestyle choices, including:  Daily care of your teeth and gums.  Regular physical activity.  Eating a healthy diet.  Avoiding tobacco and drug use.  Limiting alcohol use.  Practicing safe sex.  Taking low doses of aspirin every day.  Taking vitamin and mineral supplements as recommended by your health care provider. What happens during an annual well check? The services and screenings done by your health care provider during your annual well check will depend on your age, overall health, lifestyle risk  factors, and family history of disease. Counseling  Your health care provider may ask you questions about your:  Alcohol use.  Tobacco use.  Drug use.  Emotional well-being.  Home and relationship well-being.  Sexual activity.  Eating habits.  History of falls.  Memory and ability to understand (cognition).  Work and work Astronome[employer]vironment. Screening  You may have the following tests or measurements:  Height, weight, and BMI.  Blood pressure.  Lipid and cholesterol levels. These may be checked every 5 years, or more frequently if you are over 141 years old.  Skin check.  Lung cancer screening. You may have this screening every year starting at age 67 if you have a 30-pack-year history of smoking and currently smoke or have quit within the past 15 years.  Fecal occult blood test (FOBT) of the stool. You may have this test every year starting at age 67.  Flexible sigmoidoscopy or colonoscopy. You may have a sigmoidoscopy every 5 years or a colonoscopy every 10 years starting at age 67.  Prostate cancer screening. Recommendations will vary depending on your family history and other risks.  Hepatitis C blood test.  Hepatitis B blood test.  Sexually transmitted disease (STD) testing.  Diabetes screening. This is done by checking your blood sugar (glucose) after you have not eaten for a while (fasting). You may have this done every 1-3 years.  Abdominal aortic aneurysm (AAA) screening. You may need this if you are a current or former smoker.  Osteoporosis. You may be screened starting at age 67 if you are at high risk. Talk with your health care provider about your test results, treatment  options, and if necessary, the need for more tests. Vaccines  Your health care provider may recommend certain vaccines, such as:  Influenza vaccine. This is recommended every year.  Tetanus, diphtheria, and acellular pertussis (Tdap, Td) vaccine. You may need a Td booster every 10  years.  Zoster vaccine. You may need this after age 61.  Pneumococcal 13-valent conjugate (PCV13) vaccine. One dose is recommended after age 54.  Pneumococcal polysaccharide (PPSV23) vaccine. One dose is recommended after age 1. Talk to your health care provider about which screenings and vaccines you need and how often you need them. This information is not intended to replace advice given to you by your health care provider. Make sure you discuss any questions you have with your health care provider. Document Released: 04/10/2015 Document Revised: 12/02/2015 Document Reviewed: 01/13/2015 Elsevier Interactive Patient Education  2017 [place] Prevention in the Home Falls can cause injuries. They can happen to people of all ages. There are many things you can do to make your home safe and to help prevent falls. What can I do on the outside of my home?  Regularly fix the edges of walkways and driveways and fix any cracks.  Remove anything that might make you trip as you walk through a door, such as a raised step or threshold.  Trim any bushes or trees on the path to your home.  Use bright outdoor lighting.  Clear any walking paths of anything that might make someone trip, such as rocks or tools.  Regularly check to see if handrails are loose or broken. Make sure that both sides of any steps have handrails.  Any raised decks and porches should have guardrails on the edges.  Have any leaves, snow, or ice cleared regularly.  Use sand or salt on walking paths during winter.  Clean up any spills in your garage right away. This includes oil or grease spills. What can I do in the bathroom?  Use night lights.  Install grab bars by the toilet and in the tub and shower. Do not use towel bars as grab bars.  Use non-skid mats or decals in the tub or shower.  If you need to sit down in the shower, use a plastic, non-slip stool.  Keep the floor dry. Clean up any water that  spills on the floor as soon as it happens.  Remove soap buildup in the tub or shower regularly.  Attach bath mats securely with double-sided non-slip rug tape.  Do not have throw rugs and other things on the floor that can make you trip. What can I do in the bedroom?  Use night lights.  Make sure that you have a light by your bed that is easy to reach.  Do not use any sheets or blankets that are too big for your bed. They should not hang down onto the floor.  Have a firm chair that has side arms. You can use this for support while you get dressed.  Do not have throw rugs and other things on the floor that can make you trip. What can I do in the kitchen?  Clean up any spills right away.  Avoid walking on wet floors.  Keep items that you use a lot in easy-to-reach places.  If you need to reach something above you, use a strong step stool that has a grab bar.  Keep electrical cords out of the way.  Do not use floor polish or wax that makes floors slippery.  If you must use wax, use non-skid floor wax.  Do not have throw rugs and other things on the floor that can make you trip. What can I do with my stairs?  Do not leave any items on the stairs.  Make sure that there are handrails on both sides of the stairs and use them. Fix handrails that are broken or loose. Make sure that handrails are as long as the stairways.  Check any carpeting to make sure that it is firmly attached to the stairs. Fix any carpet that is loose or worn.  Avoid having throw rugs at the top or bottom of the stairs. If you do have throw rugs, attach them to the floor with carpet tape.  Make sure that you have a light switch at the top of the stairs and the bottom of the stairs. If you do not have them, ask someone to add them for you. What else can I do to help prevent falls?  Wear shoes that:  Do not have high heels.  Have rubber bottoms.  Are comfortable and fit you well.  Are closed at the  toe. Do not wear sandals.  If you use a stepladder:  Make sure that it is fully opened. Do not climb a closed stepladder.  Make sure that both sides of the stepladder are locked into place.  Ask someone to hold it for you, if possible.  Clearly mark and make sure that you can see:  Any grab bars or handrails.  First and last steps.  Where the edge of each step is.  Use tools that help you move around (mobility aids) if they are needed. These include:  Canes.  Walkers.  Scooters.  Crutches.  Turn on the lights when you go into a dark area. Replace any light bulbs as soon as they burn out.  Set up your furniture so you have a clear path. Avoid moving your furniture around.  If any of your floors are uneven, fix them.  If there are any pets around you, be aware of where they are.  Review your medicines with your doctor. Some medicines can make you feel dizzy. This can increase your chance of falling. Ask your doctor what other things that you can do to help prevent falls. This information is not intended to replace advice given to you by your health care provider. Make sure you discuss any questions you have with your health care provider. Document Released: 01/08/2009 Document Revised: 08/20/2015 Document Reviewed: 04/18/2014 Elsevier Interactive Patient Education  2017 [employer].

## 2016-12-12 ENCOUNTER — Ambulatory Visit: Payer: PPO | Admitting: Family Medicine

## 2016-12-13 ENCOUNTER — Other Ambulatory Visit: Payer: Self-pay | Admitting: Family Medicine

## 2017-02-06 ENCOUNTER — Telehealth: Payer: Self-pay | Admitting: Family Medicine

## 2017-02-07 ENCOUNTER — Other Ambulatory Visit: Payer: Self-pay | Admitting: Family Medicine

## 2017-02-07 MED ORDER — VALSARTAN-HYDROCHLOROTHIAZIDE 320-25 MG PO TABS: 1.0000 | ORAL_TABLET | Freq: Every day | ORAL | 1 refills | Status: AC

## 2017-02-07 NOTE — Telephone Encounter (Signed)
Medication sent to pharmacy already.

## 2017-02-07 NOTE — Telephone Encounter (Signed)
Spoke with patients wife she states since they moved to [place] they are required to do the mail order pharmacy--[facility]  Valsartan  Thanks

## 2018-02-24 IMAGING — CR DG CHEST 2V
1 series · 2 of 2 positions shown · non-contrast
Comparison: None.

CLINICAL DATA: Acute onset of right mid chest pain and tightness.
Difficulty breathing. Initial encounter.

EXAM:
CHEST  2 VIEW

[Series 1: dg chest 2 view · 0.14mm/px · 2 of 2 slices shown]
[im 1/2]
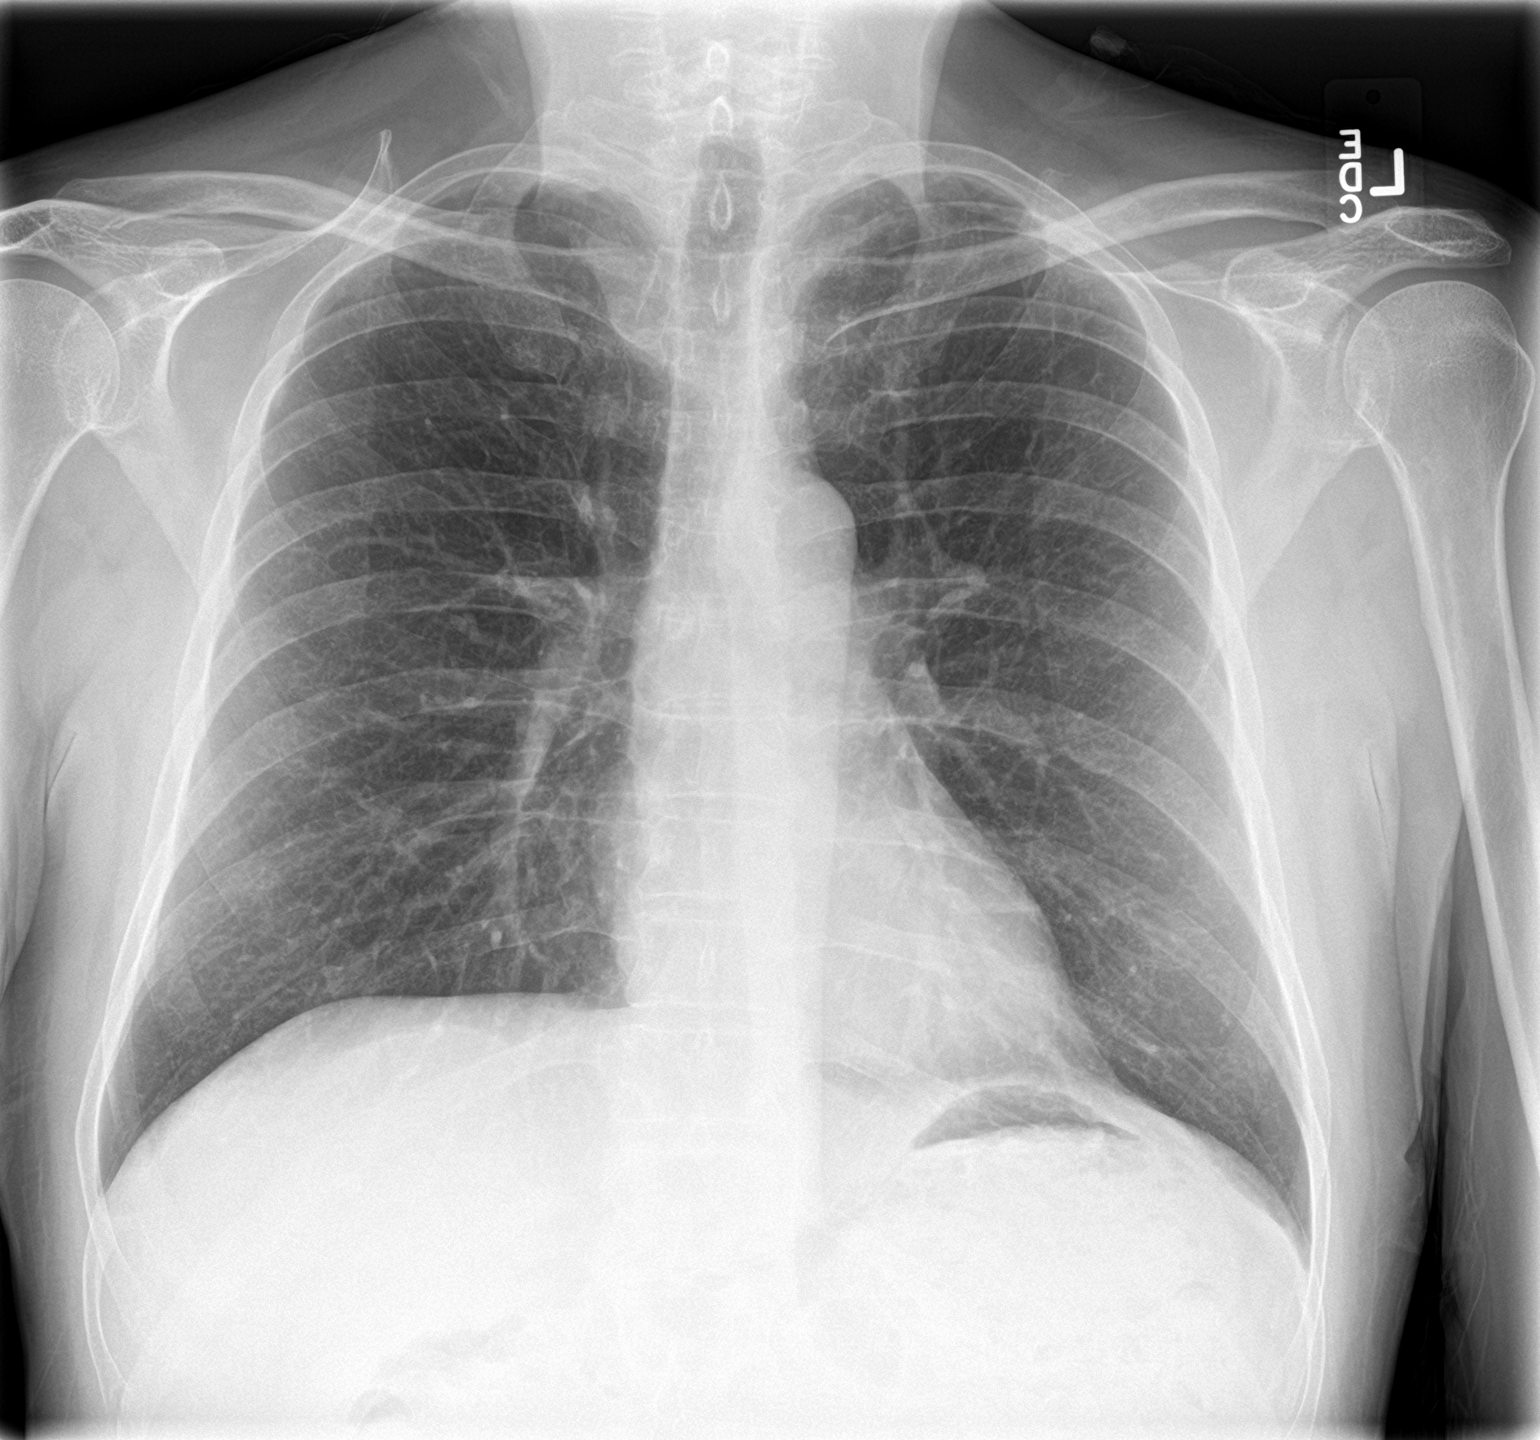
[im 2/2]
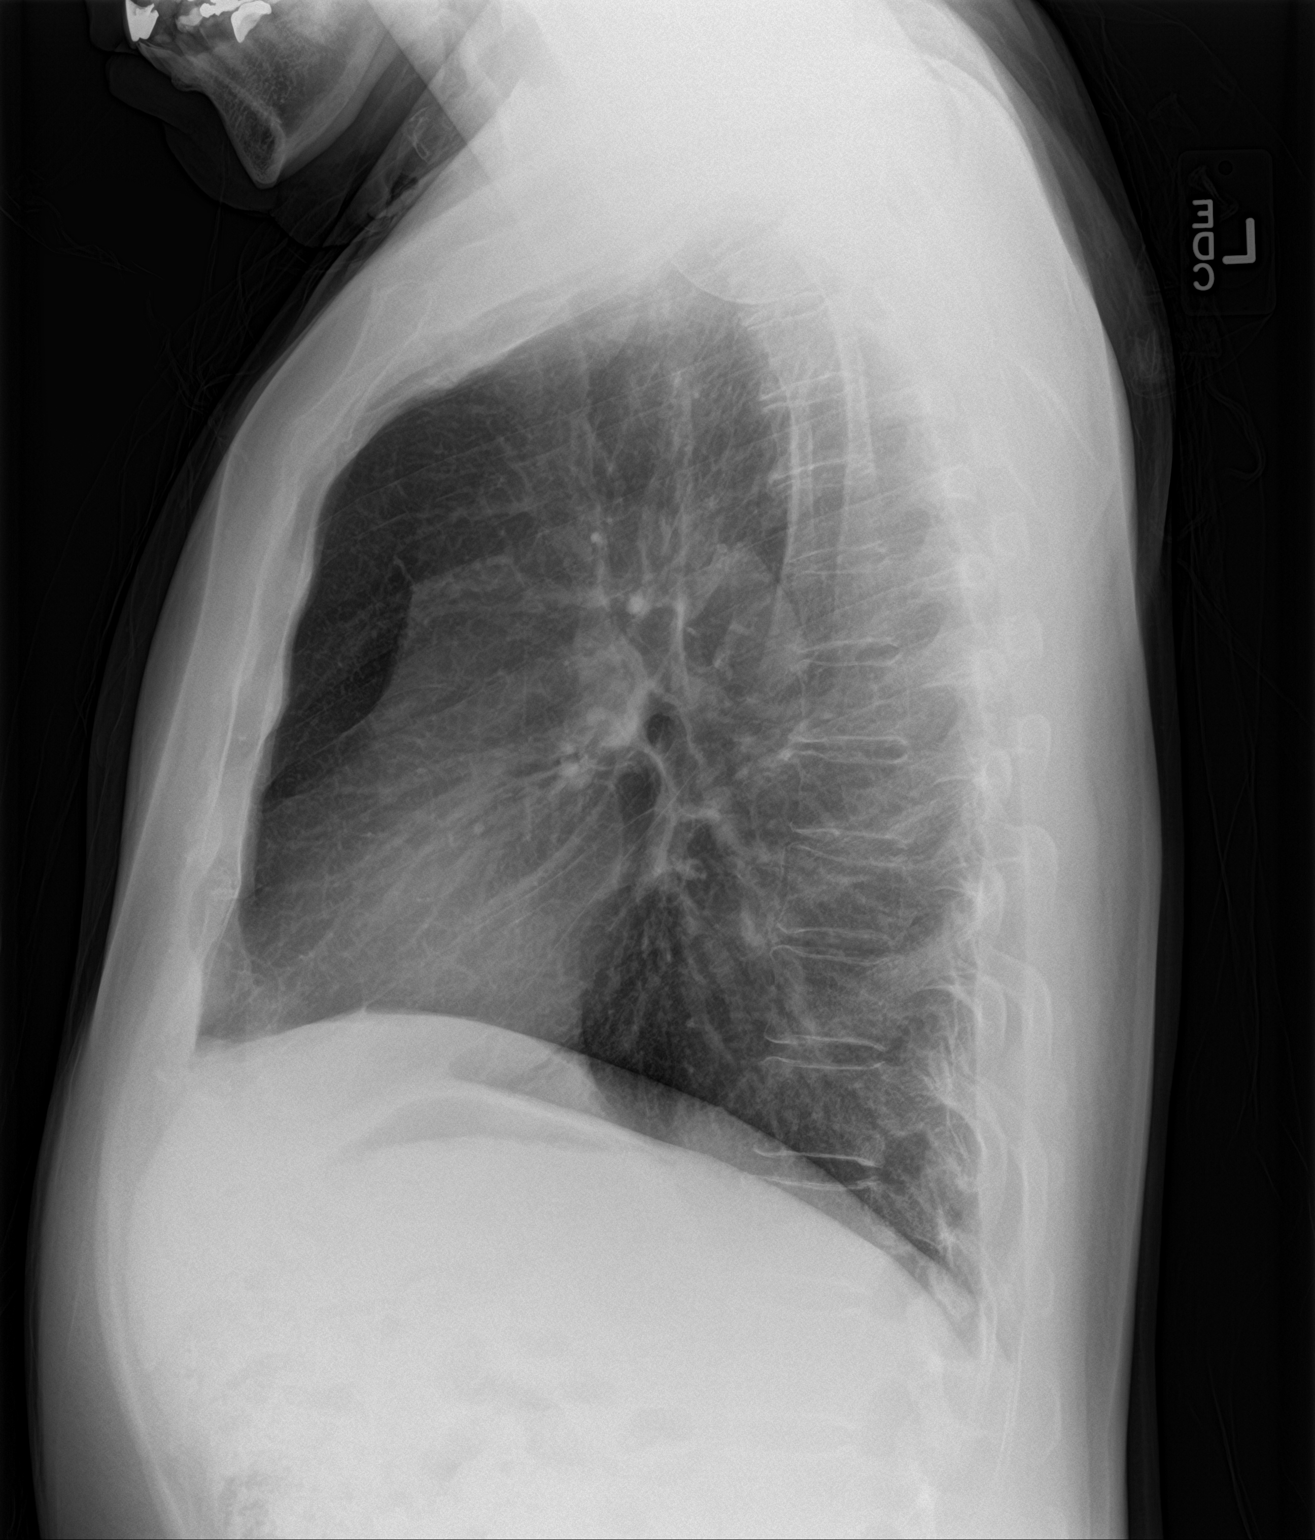

[2 of 2 positions shown; findings below may reference images not displayed]

FINDINGS: The lungs are well-aerated and clear. There is no evidence of focal
opacification, pleural effusion or pneumothorax.

The heart is normal in size; the mediastinal contour is within
normal limits. No acute osseous abnormalities are seen.
IMPRESSION: No acute cardiopulmonary process seen.

## 2018-06-13 ENCOUNTER — Telehealth: Payer: Self-pay | Admitting: General Practice

## 2018-06-13 NOTE — Telephone Encounter (Signed)
Called to schedule Medicare Annual Wellness Visit with the Nurse Health Advisor. Patient states he has moved to [place].  Trixie Rude, Care Guide.
